# Patient Record
Sex: Male | Born: 1960 | Race: White | Hispanic: No | Marital: Married | Smoking: Never smoker
Health system: Southern US, Community
[De-identification: ages and names within clinical notes are randomized; demographics above are authoritative.]

## PROBLEM LIST (undated history)

## (undated) DIAGNOSIS — I10 Essential (primary) hypertension: Secondary | ICD-10-CM

## (undated) DIAGNOSIS — E119 Type 2 diabetes mellitus without complications: Secondary | ICD-10-CM

## (undated) DIAGNOSIS — J45909 Unspecified asthma, uncomplicated: Secondary | ICD-10-CM

## (undated) DIAGNOSIS — I509 Heart failure, unspecified: Secondary | ICD-10-CM

## (undated) DIAGNOSIS — M1990 Unspecified osteoarthritis, unspecified site: Secondary | ICD-10-CM

## (undated) HISTORY — PX: OTHER SURGICAL HISTORY: SHX169

## (undated) HISTORY — PX: HERNIA REPAIR: SHX51

---

## 2013-08-03 ENCOUNTER — Encounter (HOSPITAL_COMMUNITY): Payer: Self-pay | Admitting: Emergency Medicine

## 2013-08-03 ENCOUNTER — Inpatient Hospital Stay (HOSPITAL_COMMUNITY)
Admission: EM | Admit: 2013-08-03 | Discharge: 2013-08-25 | DRG: 871 | Disposition: E | Payer: Medicare Other | Attending: Internal Medicine | Admitting: Internal Medicine

## 2013-08-03 ENCOUNTER — Emergency Department (HOSPITAL_COMMUNITY): Payer: Medicare Other

## 2013-08-03 DIAGNOSIS — Z885 Allergy status to narcotic agent status: Secondary | ICD-10-CM | POA: Diagnosis not present

## 2013-08-03 DIAGNOSIS — H543 Unqualified visual loss, both eyes: Secondary | ICD-10-CM

## 2013-08-03 DIAGNOSIS — Z888 Allergy status to other drugs, medicaments and biological substances status: Secondary | ICD-10-CM

## 2013-08-03 DIAGNOSIS — R0602 Shortness of breath: Secondary | ICD-10-CM | POA: Diagnosis present

## 2013-08-03 DIAGNOSIS — I509 Heart failure, unspecified: Secondary | ICD-10-CM | POA: Diagnosis present

## 2013-08-03 DIAGNOSIS — E119 Type 2 diabetes mellitus without complications: Secondary | ICD-10-CM | POA: Diagnosis present

## 2013-08-03 DIAGNOSIS — Z9981 Dependence on supplemental oxygen: Secondary | ICD-10-CM

## 2013-08-03 DIAGNOSIS — J45909 Unspecified asthma, uncomplicated: Secondary | ICD-10-CM | POA: Diagnosis present

## 2013-08-03 DIAGNOSIS — H409 Unspecified glaucoma: Secondary | ICD-10-CM | POA: Diagnosis present

## 2013-08-03 DIAGNOSIS — N170 Acute kidney failure with tubular necrosis: Secondary | ICD-10-CM | POA: Diagnosis present

## 2013-08-03 DIAGNOSIS — M129 Arthropathy, unspecified: Secondary | ICD-10-CM | POA: Diagnosis present

## 2013-08-03 DIAGNOSIS — J9622 Acute and chronic respiratory failure with hypercapnia: Secondary | ICD-10-CM

## 2013-08-03 DIAGNOSIS — J189 Pneumonia, unspecified organism: Secondary | ICD-10-CM | POA: Diagnosis present

## 2013-08-03 DIAGNOSIS — IMO0002 Reserved for concepts with insufficient information to code with codable children: Secondary | ICD-10-CM

## 2013-08-03 DIAGNOSIS — Z515 Encounter for palliative care: Secondary | ICD-10-CM

## 2013-08-03 DIAGNOSIS — N189 Chronic kidney disease, unspecified: Secondary | ICD-10-CM | POA: Diagnosis present

## 2013-08-03 DIAGNOSIS — H548 Legal blindness, as defined in USA: Secondary | ICD-10-CM | POA: Diagnosis present

## 2013-08-03 DIAGNOSIS — N179 Acute kidney failure, unspecified: Secondary | ICD-10-CM

## 2013-08-03 DIAGNOSIS — Z66 Do not resuscitate: Secondary | ICD-10-CM | POA: Diagnosis present

## 2013-08-03 DIAGNOSIS — A419 Sepsis, unspecified organism: Principal | ICD-10-CM | POA: Diagnosis present

## 2013-08-03 DIAGNOSIS — Y95 Nosocomial condition: Secondary | ICD-10-CM

## 2013-08-03 DIAGNOSIS — H547 Unspecified visual loss: Secondary | ICD-10-CM | POA: Diagnosis present

## 2013-08-03 DIAGNOSIS — Z6841 Body Mass Index (BMI) 40.0 and over, adult: Secondary | ICD-10-CM

## 2013-08-03 DIAGNOSIS — E662 Morbid (severe) obesity with alveolar hypoventilation: Secondary | ICD-10-CM | POA: Diagnosis present

## 2013-08-03 DIAGNOSIS — Z9119 Patient's noncompliance with other medical treatment and regimen: Secondary | ICD-10-CM

## 2013-08-03 DIAGNOSIS — J9620 Acute and chronic respiratory failure, unspecified whether with hypoxia or hypercapnia: Secondary | ICD-10-CM | POA: Diagnosis present

## 2013-08-03 DIAGNOSIS — I129 Hypertensive chronic kidney disease with stage 1 through stage 4 chronic kidney disease, or unspecified chronic kidney disease: Secondary | ICD-10-CM | POA: Diagnosis present

## 2013-08-03 DIAGNOSIS — N058 Unspecified nephritic syndrome with other morphologic changes: Secondary | ICD-10-CM | POA: Diagnosis present

## 2013-08-03 DIAGNOSIS — Z91199 Patient's noncompliance with other medical treatment and regimen due to unspecified reason: Secondary | ICD-10-CM | POA: Diagnosis not present

## 2013-08-03 DIAGNOSIS — R6521 Severe sepsis with septic shock: Secondary | ICD-10-CM

## 2013-08-03 DIAGNOSIS — R6520 Severe sepsis without septic shock: Secondary | ICD-10-CM

## 2013-08-03 DIAGNOSIS — G4733 Obstructive sleep apnea (adult) (pediatric): Secondary | ICD-10-CM | POA: Diagnosis present

## 2013-08-03 DIAGNOSIS — R6510 Systemic inflammatory response syndrome (SIRS) of non-infectious origin without acute organ dysfunction: Secondary | ICD-10-CM

## 2013-08-03 DIAGNOSIS — N19 Unspecified kidney failure: Secondary | ICD-10-CM

## 2013-08-03 HISTORY — DX: Type 2 diabetes mellitus without complications: E11.9

## 2013-08-03 HISTORY — DX: Heart failure, unspecified: I50.9

## 2013-08-03 HISTORY — DX: Unspecified osteoarthritis, unspecified site: M19.90

## 2013-08-03 HISTORY — DX: Essential (primary) hypertension: I10

## 2013-08-03 HISTORY — DX: Unspecified asthma, uncomplicated: J45.909

## 2013-08-03 LAB — CBC WITH DIFFERENTIAL/PLATELET
Basophils Absolute: 0 10*3/uL (ref 0.0–0.1)
Basophils Relative: 0 % (ref 0–1)
EOS ABS: 0 10*3/uL (ref 0.0–0.7)
Eosinophils Relative: 0 % (ref 0–5)
HCT: 48.4 % (ref 39.0–52.0)
Hemoglobin: 15 g/dL (ref 13.0–17.0)
Lymphocytes Relative: 4 % — ABNORMAL LOW (ref 12–46)
Lymphs Abs: 0.7 10*3/uL (ref 0.7–4.0)
MCH: 28.8 pg (ref 26.0–34.0)
MCHC: 31 g/dL (ref 30.0–36.0)
MCV: 92.9 fL (ref 78.0–100.0)
Monocytes Absolute: 0.7 10*3/uL (ref 0.1–1.0)
Monocytes Relative: 4 % (ref 3–12)
Neutro Abs: 15.5 10*3/uL — ABNORMAL HIGH (ref 1.7–7.7)
Neutrophils Relative %: 92 % — ABNORMAL HIGH (ref 43–77)
PLATELETS: 178 10*3/uL (ref 150–400)
RBC: 5.21 MIL/uL (ref 4.22–5.81)
RDW: 14.1 % (ref 11.5–15.5)
WBC: 16.9 10*3/uL — ABNORMAL HIGH (ref 4.0–10.5)

## 2013-08-03 LAB — I-STAT CHEM 8, ED
BUN: 45 mg/dL — AB (ref 6–23)
Calcium, Ion: 1.04 mmol/L — ABNORMAL LOW (ref 1.12–1.23)
Chloride: 92 mEq/L — ABNORMAL LOW (ref 96–112)
Creatinine, Ser: 3.8 mg/dL — ABNORMAL HIGH (ref 0.50–1.35)
GLUCOSE: 215 mg/dL — AB (ref 70–99)
HCT: 52 % (ref 39.0–52.0)
Hemoglobin: 17.7 g/dL — ABNORMAL HIGH (ref 13.0–17.0)
Potassium: 3.6 mEq/L — ABNORMAL LOW (ref 3.7–5.3)
Sodium: 136 mEq/L — ABNORMAL LOW (ref 137–147)
TCO2: 28 mmol/L (ref 0–100)

## 2013-08-03 LAB — I-STAT TROPONIN, ED: TROPONIN I, POC: 0.03 ng/mL (ref 0.00–0.08)

## 2013-08-03 LAB — PROTIME-INR
INR: 0.96 (ref 0.00–1.49)
Prothrombin Time: 12.8 seconds (ref 11.6–15.2)

## 2013-08-03 LAB — PRO B NATRIURETIC PEPTIDE: PRO B NATRI PEPTIDE: 1658 pg/mL — AB (ref 0–125)

## 2013-08-03 MED ORDER — VANCOMYCIN HCL 10 G IV SOLR
2500.0000 mg | Freq: Once | INTRAVENOUS | Status: AC
  Administered 2013-08-03: 2500 mg via INTRAVENOUS
  Filled 2013-08-03: qty 2500

## 2013-08-03 MED ORDER — PIPERACILLIN-TAZOBACTAM 3.375 G IVPB 30 MIN
3.3750 g | Freq: Once | INTRAVENOUS | Status: DC
  Filled 2013-08-03: qty 50

## 2013-08-03 MED ORDER — ONDANSETRON HCL 4 MG PO TABS: 4.0000 mg | ORAL_TABLET | Freq: Four times a day (QID) | ORAL | Status: DC | PRN

## 2013-08-03 MED ORDER — ACETAMINOPHEN 650 MG RE SUPP: 650.0000 mg | Freq: Four times a day (QID) | RECTAL | Status: DC | PRN

## 2013-08-03 MED ORDER — VANCOMYCIN HCL IN DEXTROSE 1-5 GM/200ML-% IV SOLN: 1000.0000 mg | Freq: Once | INTRAVENOUS | Status: DC

## 2013-08-03 MED ORDER — SODIUM CHLORIDE 0.9 % IV SOLN
INTRAVENOUS | Status: DC
  Administered 2013-08-03 – 2013-08-04 (×2): via INTRAVENOUS

## 2013-08-03 MED ORDER — MORPHINE SULFATE 2 MG/ML IJ SOLN: 1.0000 mg | INTRAMUSCULAR | Status: DC | PRN

## 2013-08-03 MED ORDER — ACETAMINOPHEN 325 MG PO TABS: 650.0000 mg | ORAL_TABLET | Freq: Four times a day (QID) | ORAL | Status: DC | PRN

## 2013-08-03 MED ORDER — INSULIN ASPART 100 UNIT/ML ~~LOC~~ SOLN: 0.0000 [IU] | Freq: Every day | SUBCUTANEOUS | Status: DC

## 2013-08-03 MED ORDER — HEPARIN SODIUM (PORCINE) 5000 UNIT/ML IJ SOLN
5000.0000 [IU] | Freq: Three times a day (TID) | INTRAMUSCULAR | Status: DC
Start: 2013-08-04 — End: 2013-08-05
  Administered 2013-08-04 – 2013-08-05 (×4): 5000 [IU] via SUBCUTANEOUS
  Filled 2013-08-03 (×7): qty 1

## 2013-08-03 MED ORDER — VANCOMYCIN HCL 10 G IV SOLR
2000.0000 mg | INTRAVENOUS | Status: DC
  Administered 2013-08-04: 2000 mg via INTRAVENOUS
  Filled 2013-08-03 (×3): qty 2000

## 2013-08-03 MED ORDER — PANTOPRAZOLE SODIUM 40 MG PO TBEC
40.0000 mg | DELAYED_RELEASE_TABLET | Freq: Every day | ORAL | Status: DC
  Administered 2013-08-04: 40 mg via ORAL
  Filled 2013-08-03: qty 1

## 2013-08-03 MED ORDER — ALPRAZOLAM 0.5 MG PO TABS
1.0000 mg | ORAL_TABLET | Freq: Three times a day (TID) | ORAL | Status: DC
  Administered 2013-08-04 (×2): 1 mg via ORAL
  Filled 2013-08-03 (×2): qty 2

## 2013-08-03 MED ORDER — PIPERACILLIN-TAZOBACTAM 3.375 G IVPB
3.3750 g | Freq: Three times a day (TID) | INTRAVENOUS | Status: DC
  Administered 2013-08-04 – 2013-08-05 (×4): 3.375 g via INTRAVENOUS
  Filled 2013-08-03 (×6): qty 50

## 2013-08-03 MED ORDER — LEVOFLOXACIN IN D5W 750 MG/150ML IV SOLN
750.0000 mg | Freq: Once | INTRAVENOUS | Status: DC
  Administered 2013-08-03: 750 mg via INTRAVENOUS
  Filled 2013-08-03: qty 150

## 2013-08-03 MED ORDER — OXYCODONE HCL 5 MG PO TABS
5.0000 mg | ORAL_TABLET | ORAL | Status: DC | PRN
  Administered 2013-08-04 – 2013-08-05 (×2): 5 mg via ORAL
  Filled 2013-08-03 (×2): qty 1

## 2013-08-03 MED ORDER — IPRATROPIUM-ALBUTEROL 0.5-2.5 (3) MG/3ML IN SOLN
3.0000 mL | Freq: Four times a day (QID) | RESPIRATORY_TRACT | Status: DC
  Administered 2013-08-04 – 2013-08-05 (×5): 3 mL via RESPIRATORY_TRACT
  Filled 2013-08-03 (×5): qty 3

## 2013-08-03 MED ORDER — ONDANSETRON HCL 4 MG/2ML IJ SOLN: 4.0000 mg | Freq: Four times a day (QID) | INTRAMUSCULAR | Status: DC | PRN

## 2013-08-03 MED ORDER — ALBUTEROL SULFATE (2.5 MG/3ML) 0.083% IN NEBU: 2.5000 mg | INHALATION_SOLUTION | RESPIRATORY_TRACT | Status: DC | PRN

## 2013-08-03 MED ORDER — SODIUM CHLORIDE 0.9 % IJ SOLN
3.0000 mL | Freq: Two times a day (BID) | INTRAMUSCULAR | Status: DC
Start: 2013-08-04 — End: 2013-08-06
  Administered 2013-08-03 – 2013-08-05 (×5): 3 mL via INTRAVENOUS

## 2013-08-03 MED ORDER — INSULIN ASPART 100 UNIT/ML ~~LOC~~ SOLN
0.0000 [IU] | Freq: Three times a day (TID) | SUBCUTANEOUS | Status: DC
  Administered 2013-08-04: 5 [IU] via SUBCUTANEOUS

## 2013-08-03 MED ORDER — METHYLPREDNISOLONE SODIUM SUCC 125 MG IJ SOLR
125.0000 mg | Freq: Three times a day (TID) | INTRAMUSCULAR | Status: DC
  Administered 2013-08-04: 125 mg via INTRAVENOUS
  Filled 2013-08-03 (×4): qty 2

## 2013-08-03 MED ORDER — AMLODIPINE BESYLATE 10 MG PO TABS
10.0000 mg | ORAL_TABLET | Freq: Every day | ORAL | Status: DC
  Filled 2013-08-03: qty 1

## 2013-08-03 MED ORDER — ZOLPIDEM TARTRATE 5 MG PO TABS
10.0000 mg | ORAL_TABLET | Freq: Every day | ORAL | Status: DC
  Administered 2013-08-04: 10 mg via ORAL
  Filled 2013-08-03: qty 2
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
## 2013-08-03 NOTE — Progress Notes (Signed)
ANTIBIOTIC CONSULT NOTE - INITIAL  Pharmacy Consult for vancomycin and zosyn Indication: rule out sepsis  Allergies  Allergen Reactions  . Betadine [Povidone Iodine] Hives  . Tylenol With Codeine #3 [Acetaminophen-Codeine] Nausea And Vomiting  . Vicodin [Hydrocodone-Acetaminophen] Nausea And Vomiting    Patient Measurements: Height: 5\' 9"  (175.3 cm) Weight: 420 lb (190.511 kg) IBW/kg (Calculated) : 70.7   Vital Signs: Temp: 94.2 F (34.6 C) (07/10 1951) Temp src: Rectal (07/10 1951) BP: 123/84 mmHg (07/10 2045) Pulse Rate: 81 (07/10 2045) Intake/Output from previous day:   Intake/Output from this shift:    Labs:  Recent Labs  08/17/2013 1930 07/27/2013 1942  WBC 16.9*  --   HGB 15.0 17.7*  PLT 178  --   CREATININE  --  3.80*   Estimated Creatinine Clearance: 37.3 ml/min (by C-G formula based on Cr of 3.8). No results found for this basename: VANCOTROUGH, VANCOPEAK, VANCORANDOM, GENTTROUGH, GENTPEAK, GENTRANDOM, TOBRATROUGH, TOBRAPEAK, TOBRARND, AMIKACINPEAK, AMIKACINTROU, AMIKACIN,  in the last 72 hours   Microbiology: No results found for this or any previous visit (from the past 720 hour(s)).  Medical History: Past Medical History  Diagnosis Date  . Arthritis   . CHF (congestive heart failure)   . Asthma   . Diabetes mellitus without complication   . Hypertension     Medications:  See med list in EMR - patient has received multiple abx recently.  Assessment: 53 year old male with recurrent pneumonia who has been treated with multiple antibiotics and was currently on doxycycline pta. Patient hypothermic in ED, wbc elevated at 17, patient to be admitted for sepsis/HCAP. Orders to continue vancomycin and zosyn that was ordered in the ED. Patient appears to be in acute renal failure with scr of 3.8 (no baseline available). Will dose adjust abx accordingly.  Goal of Therapy:  Vancomycin trough level 15-20 mcg/ml  Plan:  Measure antibiotic drug levels at  steady state Follow up culture results Vancomycin 2.5g now then 2g daily May need random levels as crcl may not be totally indicative of renal function given obsesity Continue zosyn 3.375g IV q8 hours - infuse each dose over 4 hours Follow renal function closely   Sheppard CoilFrank Kailynne Ferrington PharmD., BCPS Clinical Pharmacist Pager [phone] 08/05/2013 10:29 PM

## 2013-08-03 NOTE — ED Notes (Signed)
Pt brought by EMS for SOB, and light pain, pt just travel by car form [place]. Pt with hx of COPD and CHF. VSS by EMS BP 127/70 ,HR 81, R-24 O2 saturation from 64 to 82% on RA. CBG 240 by EMS. Pt denies any chest pain at this time.

## 2013-08-04 ENCOUNTER — Inpatient Hospital Stay (HOSPITAL_COMMUNITY): Payer: Medicare Other

## 2013-08-04 DIAGNOSIS — J189 Pneumonia, unspecified organism: Secondary | ICD-10-CM

## 2013-08-04 DIAGNOSIS — A419 Sepsis, unspecified organism: Secondary | ICD-10-CM

## 2013-08-04 DIAGNOSIS — Y95 Nosocomial condition: Secondary | ICD-10-CM

## 2013-08-04 DIAGNOSIS — J9620 Acute and chronic respiratory failure, unspecified whether with hypoxia or hypercapnia: Secondary | ICD-10-CM

## 2013-08-04 DIAGNOSIS — R6521 Severe sepsis with septic shock: Secondary | ICD-10-CM

## 2013-08-04 DIAGNOSIS — N179 Acute kidney failure, unspecified: Secondary | ICD-10-CM

## 2013-08-04 DIAGNOSIS — I517 Cardiomegaly: Secondary | ICD-10-CM

## 2013-08-04 DIAGNOSIS — N189 Chronic kidney disease, unspecified: Secondary | ICD-10-CM

## 2013-08-04 LAB — CBC
HCT: 43.3 % (ref 39.0–52.0)
HEMOGLOBIN: 13.4 g/dL (ref 13.0–17.0)
MCH: 28.3 pg (ref 26.0–34.0)
MCHC: 30.9 g/dL (ref 30.0–36.0)
MCV: 91.5 fL (ref 78.0–100.0)
Platelets: 161 10*3/uL (ref 150–400)
RBC: 4.73 MIL/uL (ref 4.22–5.81)
RDW: 14.2 % (ref 11.5–15.5)
WBC: 15.4 10*3/uL — ABNORMAL HIGH (ref 4.0–10.5)

## 2013-08-04 LAB — POCT I-STAT 7, (LYTES, BLD GAS, ICA,H+H)
Acid-Base Excess: 2 mmol/L (ref 0.0–2.0)
Bicarbonate: 32.2 mEq/L — ABNORMAL HIGH (ref 20.0–24.0)
Calcium, Ion: 1.01 mmol/L — ABNORMAL LOW (ref 1.12–1.23)
HCT: 37 % — ABNORMAL LOW (ref 39.0–52.0)
Hemoglobin: 12.6 g/dL — ABNORMAL LOW (ref 13.0–17.0)
O2 Saturation: 97 %
POTASSIUM: 4.2 meq/L (ref 3.7–5.3)
Patient temperature: 98
Sodium: 133 mEq/L — ABNORMAL LOW (ref 137–147)
TCO2: 35 mmol/L (ref 0–100)
pCO2 arterial: 77.3 mmHg (ref 35.0–45.0)
pH, Arterial: 7.225 — ABNORMAL LOW (ref 7.350–7.450)
pO2, Arterial: 108 mmHg — ABNORMAL HIGH (ref 80.0–100.0)

## 2013-08-04 LAB — POCT I-STAT 3, ART BLOOD GAS (G3+)
ACID-BASE DEFICIT: 1 mmol/L (ref 0.0–2.0)
Bicarbonate: 30.2 mEq/L — ABNORMAL HIGH (ref 20.0–24.0)
O2 Saturation: 96 %
Patient temperature: 98.1
TCO2: 33 mmol/L (ref 0–100)
pCO2 arterial: 79.8 mmHg (ref 35.0–45.0)
pH, Arterial: 7.184 — CL (ref 7.350–7.450)
pO2, Arterial: 109 mmHg — ABNORMAL HIGH (ref 80.0–100.0)

## 2013-08-04 LAB — GLUCOSE, CAPILLARY
Glucose-Capillary: 140 mg/dL — ABNORMAL HIGH (ref 70–99)
Glucose-Capillary: 183 mg/dL — ABNORMAL HIGH (ref 70–99)
Glucose-Capillary: 203 mg/dL — ABNORMAL HIGH (ref 70–99)
Glucose-Capillary: 169 mg/dL — ABNORMAL HIGH (ref 70–99)

## 2013-08-04 LAB — MRSA PCR SCREENING: MRSA by PCR: NEGATIVE

## 2013-08-04 LAB — BASIC METABOLIC PANEL
Anion gap: 21 — ABNORMAL HIGH (ref 5–15)
BUN: 45 mg/dL — AB (ref 6–23)
CO2: 29 meq/L (ref 19–32)
CREATININE: 4.11 mg/dL — AB (ref 0.50–1.35)
Calcium: 8.4 mg/dL (ref 8.4–10.5)
Chloride: 92 mEq/L — ABNORMAL LOW (ref 96–112)
GFR calc non Af Amer: 15 mL/min — ABNORMAL LOW (ref >90)
GFR, EST AFRICAN AMERICAN: 18 mL/min — AB (ref >90)
Glucose, Bld: 164 mg/dL — ABNORMAL HIGH (ref 70–99)
Potassium: 4.2 mEq/L (ref 3.7–5.3)
SODIUM: 142 meq/L (ref 137–147)

## 2013-08-04 LAB — LACTIC ACID, PLASMA: Lactic Acid, Venous: 0.6 mmol/L (ref 0.5–2.2)

## 2013-08-04 LAB — HEMOGLOBIN A1C
HEMOGLOBIN A1C: 7.7 % — AB (ref <5.7)
Mean Plasma Glucose: 174 mg/dL — ABNORMAL HIGH (ref <117)

## 2013-08-04 MED ORDER — LIDOCAINE HCL (CARDIAC) 20 MG/ML IV SOLN
INTRAVENOUS | Status: AC
  Filled 2013-08-04: qty 5

## 2013-08-04 MED ORDER — ETOMIDATE 2 MG/ML IV SOLN
INTRAVENOUS | Status: AC
  Filled 2013-08-04: qty 20

## 2013-08-04 MED ORDER — LIDOCAINE HCL (PF) 1 % IJ SOLN
INTRAMUSCULAR | Status: AC
  Filled 2013-08-04: qty 5

## 2013-08-04 MED ORDER — SUCCINYLCHOLINE CHLORIDE 20 MG/ML IJ SOLN
INTRAMUSCULAR | Status: AC
  Filled 2013-08-04: qty 1

## 2013-08-04 MED ORDER — ROCURONIUM BROMIDE 50 MG/5ML IV SOLN
INTRAVENOUS | Status: AC
  Filled 2013-08-04: qty 2

## 2013-08-04 MED ORDER — SODIUM CHLORIDE 0.9 % IV BOLUS (SEPSIS)
1000.0000 mL | Freq: Once | INTRAVENOUS | Status: AC
  Administered 2013-08-04: 1000 mL via INTRAVENOUS

## 2013-08-04 MED ORDER — NOREPINEPHRINE BITARTRATE 1 MG/ML IV SOLN
2.0000 ug/min | INTRAVENOUS | Status: DC
  Administered 2013-08-04: 10 ug/min via INTRAVENOUS
  Filled 2013-08-04 (×2): qty 4

## 2013-08-04 MED ORDER — SODIUM CHLORIDE 0.9 % IV BOLUS (SEPSIS): 1000.0000 mL | Freq: Once | INTRAVENOUS | Status: DC

## 2013-08-04 MED ORDER — DEXTROSE 5 % IV SOLN
500.0000 mg | INTRAVENOUS | Status: DC
  Administered 2013-08-04: 500 mg via INTRAVENOUS
  Filled 2013-08-04 (×3): qty 500

## 2013-08-04 NOTE — Progress Notes (Signed)
  [place] TEAM 1 - Stepdown/ICU TEAM  Called by RN to inform me of BP readings into the 60s systolic.  Despite this, pt is reported to have unchanged mental status.  He is reported to be alert and interactive.  He is morbidly obese, making accuracy of cuff pressure doubtful.    I have asked RT to place an arterial line to allow for accurate BP monitoring.  If his SBP is confirmed to be in the 60s, we will have to consider CVL placement and the initiation of pressors (i.e. consult PCCM).  Lonia BloodJeffrey T. Ahaan Zobrist, MD [facility] For Consults/Admissions - Flow Manager - [phone] Office  [phone] Pager [phone]  On-Call/Text Page:      [email or web address]      password [email or web address]
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
## 2013-08-04 NOTE — Procedures (Signed)
Arterial Catheter Insertion Procedure Note Joseph Joseph Sampson [id number] Dec 28, 1960  Procedure: Insertion of Arterial Catheter  Indications: Blood pressure monitoring  Procedure Details Consent: Risks of procedure as well as the alternatives and risks of each were explained to the (patient/caregiver).  Consent for procedure obtained. Time Out: Verified patient identification, verified procedure, site/side was marked, verified correct patient position, special equipment/implants available, medications/allergies/relevent history reviewed, required imaging and test results available.  Performed  Maximum sterile technique was used including antiseptics, cap, gloves, gown, hand hygiene, mask and sheet. Skin prep: Chlorhexidine 20 gauge catheter was inserted into right radial artery using the Seldinger technique.  Evaluation Blood flow good; BP tracing good. Complications: No apparent complications.   Bonney LeitzFowler, Clarece Drzewiecki C 08/04/2013

## 2013-08-04 NOTE — Progress Notes (Signed)
Pt found standing at bedside,Bipap off. Pt momentarily confused ,but re-oriented well . patient  declined reclining chair  at this time.  .   But did lie back in bed( onto Rt side as always). Pt requested Bipap mask to be placed back on (instead of 5L/[place] which was quickly placed on him initially . SBP's still reading 60's and 70's ( RUE , Rt wrist ,RLE below knee,etc....) .

## 2013-08-04 NOTE — Progress Notes (Signed)
eLink Physician-Brief Progress Note Patient Name: Dondra Spryheodore Leckrone DOB: November 28, 1960 MRN: [id number]  Date of Service  08/04/2013   HPI/Events of Note  53 yr old morbidly obese male with septic shock from pneumonia with a declining blood pressure and respiratory status.  He needs intubation and CVC placement.  Patient oriented to person, place, and situation.  Patient refuses intubation and will not lay on his back for CVC placement.  He understands that refusing these interventions or cooperating with these procedures could result in worsening of his illness and death.  He still states he does not want intubation and he will not lay on his back for CVC placement.  Multiple staff persons in room witnessed patient stating his desires.   eICU Interventions  Patient is DNR. CVC will be attempted in present position as IV access is needed to continue needed treatments.   Intervention Category Major Interventions: End of life / care limitation discussion  Henry RusselSMITH, Nejla Reasor, Demetrius Charity 08/04/2013, 8:07 PM

## 2013-08-04 NOTE — Progress Notes (Signed)
B/p's low as during noc shift .Dr(s) aware . Pt sleeping most of the time but answers questions correctly when asked.

## 2013-08-04 NOTE — ED Provider Notes (Signed)
CSN: [id number]     Arrival date & time 08-27-13  1926 History   First MD Initiated Contact with Patient 08/27/2013 1927     Chief Complaint  Patient presents with  . Shortness of Breath     (Consider location/radiation/quality/duration/timing/severity/associated sxs/prior Treatment) Patient is a 53 y.o. male presenting with shortness of breath.  Shortness of Breath Associated symptoms: cough   Associated symptoms: no abdominal pain, no chest pain, no headaches, no rash and no vomiting    patient has history of CHF and asthma. Presents for shortness of breath that began this morning. He was found to be hypoxic per EMS. He is on chronic oxygen. He has had occasional cough. He has been riding in a car down from [place]. He is blind. He has been on antibiotics recently he has been on steroids recently. Has not been in the hospital recently. He has chronic pain in his left lower leg. She appears somewhat mottled and cyanotic. he is morbidly obese  Past Medical History  Diagnosis Date  . Arthritis   . CHF (congestive heart failure)   . Asthma   . Diabetes mellitus without complication   . Hypertension    Past Surgical History  Procedure Laterality Date  . Hip sx    . Hernia repair     History reviewed. No pertinent family history. History  Substance Use Topics  . Smoking status: Never Smoker   . Smokeless tobacco: Never Used  . Alcohol Use: No    Review of Systems  Constitutional: Positive for appetite change. Negative for activity change.  Eyes: Negative for pain.  Respiratory: Positive for cough and shortness of breath. Negative for chest tightness.   Cardiovascular: Positive for leg swelling. Negative for chest pain.  Gastrointestinal: Negative for nausea, vomiting, abdominal pain and diarrhea.  Genitourinary: Negative for flank pain.  Musculoskeletal: Negative for back pain and neck stiffness.  Skin: Negative for rash.  Neurological: Negative for weakness, numbness and  headaches.  Psychiatric/Behavioral: Negative for behavioral problems.      Allergies  Betadine; Tylenol with codeine #3; and Vicodin  Home Medications   Prior to Admission medications   Medication Sig Start Date End Date Taking? Authorizing Provider  albuterol (PROVENTIL) (2.5 MG/3ML) 0.083% nebulizer solution Take 2.5 mg by nebulization 4 (four) times daily.   Yes Historical Provider, MD  ALPRAZolam Prudy Feeler) 1 MG tablet Take 1 mg by mouth 4 (four) times daily.   Yes Historical Provider, MD  amLODipine (NORVASC) 10 MG tablet Take 10 mg by mouth daily.   Yes Historical Provider, MD  doxycycline (VIBRA-TABS) 100 MG tablet Take 100 mg by mouth 2 (two) times daily. 15 day course filled 07/18/13   Yes Historical Provider, MD  furosemide (LASIX) 40 MG tablet Take 40 mg by mouth 2 (two) times daily.   Yes Historical Provider, MD  hydrochlorothiazide (HYDRODIURIL) 25 MG tablet Take 25 mg by mouth daily.   Yes Historical Provider, MD  lisinopril (PRINIVIL,ZESTRIL) 10 MG tablet Take 10 mg by mouth daily.   Yes Historical Provider, MD  metFORMIN (GLUCOPHAGE) 500 MG tablet Take 500 mg by mouth 2 (two) times daily with a meal.   Yes Historical Provider, MD  oxyCODONE-acetaminophen (PERCOCET) 10-325 MG per tablet Take 1 tablet by mouth every 4 (four) hours.   Yes Historical Provider, MD  pantoprazole (PROTONIX) 40 MG tablet Take 40 mg by mouth daily.   Yes Historical Provider, MD  predniSONE (DELTASONE) 10 MG tablet Take 10 mg by mouth daily.  Yes Historical Provider, MD  temazepam (RESTORIL) 30 MG capsule Take 30 mg by mouth at bedtime.   Yes Historical Provider, MD  triamcinolone cream (KENALOG) 0.1 % Apply 1 application topically 3 (three) times daily.   Yes Historical Provider, MD  zolpidem (AMBIEN) 10 MG tablet Take 10 mg by mouth at bedtime.   Yes Historical Provider, MD   BP 104/67  Pulse 90  Temp(Src) 98.3 F (36.8 C) (Oral)  Resp 19  Ht 5\' 9"  (1.753 m)  Wt 420 lb (190.511 kg)  BMI 61.99  kg/m2  SpO2 100% Physical Exam  Constitutional: He is oriented to person, place, and time. He appears well-developed and well-nourished.  Patient is morbidly obese   Eyes: Pupils are equal, round, and reactive to light.  Neck: Normal range of motion. No JVD present.  Cardiovascular:  Mild tachycardia  Pulmonary/Chest:  Tachypnea. Harsh breath sounds, worse on right  Abdominal: Soft. There is no tenderness.  Musculoskeletal: Normal range of motion.  Neurological: He is alert and oriented to person, place, and time.  Skin:  Patient is somewhat mottled, cyanotic    ED Course  Procedures (including critical care time) Labs Review Labs Reviewed  CBC WITH DIFFERENTIAL - Abnormal; Notable for the following:    WBC 16.9 (*)    Neutrophils Relative % 92 (*)    Neutro Abs 15.5 (*)    Lymphocytes Relative 4 (*)    All other components within normal limits  PRO B NATRIURETIC PEPTIDE - Abnormal; Notable for the following:    Pro B Natriuretic peptide (BNP) 1658.0 (*)    All other components within normal limits  I-STAT CHEM 8, ED - Abnormal; Notable for the following:    Sodium 136 (*)    Potassium 3.6 (*)    Chloride 92 (*)    BUN 45 (*)    Creatinine, Ser 3.80 (*)    Glucose, Bld 215 (*)    Calcium, Ion 1.04 (*)    Hemoglobin 17.7 (*)    All other components within normal limits  MRSA PCR SCREENING  CULTURE, BLOOD (ROUTINE X 2)  CULTURE, BLOOD (ROUTINE X 2)  CULTURE, EXPECTORATED SPUTUM-ASSESSMENT  GRAM STAIN  PROTIME-INR  LEGIONELLA ANTIGEN, URINE  STREP PNEUMONIAE URINARY ANTIGEN  CBC  CREATININE, SERUM  BASIC METABOLIC PANEL  CBC  HEMOGLOBIN A1C  I-STAT TROPOININ, ED    Imaging Review Dg Chest Port 1 View  2013-05-28   CLINICAL DATA:  CHF.  Shortness of breath.  EXAM: PORTABLE CHEST - 1 VIEW  COMPARISON:  None.  FINDINGS: Cardiomegaly. Normal pulmonary vascularity. Dense right lower lobe infiltrate and atelectasis. Close follow-up chest x-rays recommended  demonstrate complete clearing to exclude underlying mass . Small right pleural effusion. No pneumothorax. Thoracic spine scoliosis.  IMPRESSION: 1. Dense right lower lobe infiltrate and atelectasis. 2. Cardiomegaly. Normal pulmonary vascularity. Although the right lower lobe infiltrate could be related to congestive heart failure findings are most consistent with pneumonia. Followup chest x-rays are recommended to demonstrate clearing and to exclude underlying mass.   Electronically Signed   By: Maisie Fushomas  Register   On: 02015-05-04 20:20     EKG Interpretation None      MDM   Final diagnoses:  Community acquired pneumonia  Renal failure    Patient with shortness of breath. Initially hypoxic. Found to have pneumonia. Has been on various antibiotics and steroids for his lungs recently. Will give Zosyn and vancomycin admitted to internal medicine.    Juliet RudeNathan R. Rubin PayorPickering, MD  08/04/13 0022 

## 2013-08-04 NOTE — Consult Note (Addendum)
PULMONARY  / CRITICAL CARE MEDICINE  Name: Joseph Sampson MRN: [id number] DOB: 06/01/1960 PCP No primary provider on file.   ADMISSION DATE:  08/24/2013 LOS 1 days  CONSULTATION DATE:  08/04/13  REFERRING MD :  [facility]   CHIEF COMPLAINT:  Pneumonia/sepsis/hypotension  BRIEF PATIENT DESCRIPTION:  53 yo morbidly obese male with DM2, OSA blindness due to glaucoma admitted 7/10 with PNA and respiratory failure. Developed worsening respiratory distress and hypotension on 7/11 and CCM consulted. He refused intubation. Now DNR/DNI. Pressors ok.    LINES / TUBES: 7/11 R radial arterial line 7/11 [facility] CV   CULTURES: 7/11 BCX x 2  ANTIBIOTICS: 7/11 Vancomycin>> 7/11 Zosyn>> 7/11 Azithro>>   SIGNIFICANT EVENTS / STUDIES:  7/10 started BiPap   HISTORY OF PRESENT ILLNESS:    53 y/o with past medical history significant for morbid obesity (419 pounds) obstructive sleep apnea on BiPAP, DM2, pneumonia treated few months ago and blindness due to glaucoma [place] [facility] with dizziness upon standing . In the emergency room he was found to be hypotensive, hypothermic, with respiratory and renal failure. Found to have a large right-sided infiltrate. Admitted to [facility] service.   On 7/11 developed progressive hypotension with BP 63/42. Started on Levophed. Abx broadened. ABG 7.22/77/108/97% on Bipap 20/4. CCM called. Patient refused intubation requesting DNR/DNI. Pressors ok.   C/o SOB. Denies CP. Refusing to get into any other position except his R side. Cr now 3.8->4.2. Baseline unclear. [facility] and no labs available.  Echo 08/04/13 EF 65-70%. RV not seen.   Patient unable to provide much history due to BiPAP and dyspnea.   ROS General: Weight gain [ ] ; Weight loss [ ] ; Anorexia [ ] ; Fatigue Cove.Etienne[y ]; Fever [ ] ; Chills [ ] ; Weakness [ ]   Cardiac: Chest pain/pressure [ ] ; Resting SOB Cove.Etienne[y ]; Exertional SOB [ ] ;  Orthopnea [ ] ; Pedal Edema [ ] ; Palpitations [ ] ; Syncope [ ] ; Presyncope [ ] ; Paroxysmal nocturnal dyspnea[ ]   Pulmonary: Cough [ y]; [facility] ]; Hemoptysis[ ] ; Sputum [ ] ; Snoring [ ]   GI: Vomiting[ ] ; Dysphagia[ ] ; Melena[ ] ; Hematochezia [ ] ; Heartburn[ ] ; Abdominal pain [ ] ; Constipation [ ] ; Diarrhea [ ] ; BRBPR [ ]   GU: Hematuria[ ] ; Dysuria [ ] ; Nocturia[ ]   Vascular: Pain in legs with walking [ ] ; Pain in feet with lying flat [ ] ; Non-healing sores [ ] ; Stroke [ ] ; TIA [ ] ; Slurred speech [ ] ;  Neuro: Headaches[ ] ; Vertigo[ ] ; Seizures[ ] ; Paresthesias[ ] ;Blurred vision [ ] ; Diplopia [ ] ; Vision changes [ ]   Ortho/Skin: Arthritis [ ] ; Joint pain y[ ] ; Muscle pain [ ] ; Joint swelling [ ] ; Back Pain [ ] ; Rash [ ]   Psych: Depression[ ] ; Anxiety[ ]   Heme: Bleeding problems [ ] ; Clotting disorders [ ] ; Anemia [ ]   Endocrine: Diabetes Cove.Etienne[y ]; Thyroid dysfunction[ ]    PAST MEDICAL HISTORY :  Past Medical History  Diagnosis Date  . Arthritis   . CHF (congestive heart failure)   . Asthma   . Diabetes mellitus without complication   . Hypertension      History   Social History  . Marital Status: Married    Spouse Name: N/A    Number of Children: N/A  . Years of Education: N/A   Occupational History  . Not on file.   Social History Main Topics  . Smoking status: Never Smoker   . Smokeless tobacco: Never  Used  . Alcohol Use: No  . Drug Use: No  . Sexual Activity: Not on file   Other Topics Concern  . Not on file   Social History Narrative  . No narrative on file    FAMILY HX: Unavailable due to condition  Allergies  Allergen Reactions  . Betadine [Povidone Iodine] Hives  . Tylenol With Codeine #3 [Acetaminophen-Codeine] Nausea And Vomiting  . Vicodin [Hydrocodone-Acetaminophen] Nausea And Vomiting      (Not in an outpatient encounter)  SUBJECTIVE:   VITAL SIGNS: Filed Vitals:   08/04/13 1615 08/04/13 1700 08/04/13 1800 08/04/13 1900  BP:  78/49 83/33 105/88   Pulse: 94   88  Temp:      TempSrc:      Resp: 16 13 19 19   Height:      Weight:      SpO2: 96%   98%      HEMODYNAMICS:   VENTILATOR SETTINGS: Vent Mode:  [-] PRVC FiO2 (%):  [30 %] 30 % Set Rate:  [28 bmp] 28 bmp Vt Set:  [420 mL] 420 mL PEEP:  [5 cmH20] 5 cmH20 Plateau Pressure:  [4 cmH20-21 cmH20] 20 cmH20 INTAKE / OUTPUT: I/O last 3 completed shifts: In: 3688.3 [P.O.:480; I.V.:608.3; IV Piggyback:2600] Out: -      PHYSICAL EXAMINATION: General:  Morbidly obese male. Lying on R side on BIPAP. Dyspneic HEENT: normal Neck: supple. Very thick. Unable to see JVD. Carotids 2+ bilat; no bruits. Cor: PMI nonpalpable. Very distant heart sounds. No obvious murmur.   Lungs: decreased BS. Mild rhonchi. No wheeze Abdomen: Markedly obese. Soft/ NT/ND. Good bs Extremities: no cyanosis, or edema Neuro: Lethargic but follows commands and can talk in brief sentences. Non-focal.    LABS: PULMONARY  Recent Labs Lab Aug 11, 2013 1942 08/04/13 1949  PHART  --  7.225*  PCO2ART  --  77.3*  PO2ART  --  108.0*  HCO3  --  32.2*  TCO2 28 35  O2SAT  --  97.0    CBC  Recent Labs Lab 08-11-2013 1930 August 11, 2013 1942 08/04/13 0038 08/04/13 1949  HGB 15.0 17.7* 13.4 12.6*  HCT 48.4 52.0 43.3 37.0*  WBC 16.9*  --  15.4*  --   PLT 178  --  161  --     COAGULATION  Recent Labs Lab 08/11/13 1930  INR 0.96    CARDIAC  No results found for this basename: TROPONINI,  in the last 168 hours  Recent Labs Lab 11-Aug-2013 1930  PROBNP 1658.0*     CHEMISTRY  Recent Labs Lab 2013-08-11 1942 08/04/13 0038 08/04/13 1949  NA 136* 142 133*  K 3.6* 4.2 4.2  CL 92* 92*  --   CO2  --  29  --   GLUCOSE 215* 164*  --   BUN 45* 45*  --   CREATININE 3.80* 4.11*  --   CALCIUM  --  8.4  --    Estimated Creatinine Clearance: 34.4 ml/min (by C-G formula based on Cr of 4.11).   LIVER  Recent Labs Lab 08-11-2013 1930  INR 0.96     INFECTIOUS No results found for this  basename: LATICACIDVEN, PROCALCITON,  in the last 168 hours   ENDOCRINE CBG (last 3)   Recent Labs  Aug 11, 2013 2354 08/04/13 1234 08/04/13 1542  GLUCAP 140* 183* 203*         IMAGING x48h  Dg Chest Port 1 View  08-11-2013   CLINICAL DATA:  CHF.  Shortness of breath.  EXAM: PORTABLE CHEST - 1 VIEW  COMPARISON:  None.  FINDINGS: Cardiomegaly. Normal pulmonary vascularity. Dense right lower lobe infiltrate and atelectasis. Close follow-up chest x-rays recommended demonstrate complete clearing to exclude underlying mass . Small right pleural effusion. No pneumothorax. Thoracic spine scoliosis.  IMPRESSION: 1. Dense right lower lobe infiltrate and atelectasis. 2. Cardiomegaly. Normal pulmonary vascularity. Although the right lower lobe infiltrate could be related to congestive heart failure findings are most consistent with pneumonia. Followup chest x-rays are recommended to demonstrate clearing and to exclude underlying mass.   Electronically Signed   By: Maisie Fus  Register   On: 08/23/2013 20:20       ASSESSMENT / PLAN:  PULMONARY A: 1) Acute on chronic respiratory failure     2) HAP - with large R-sided infiltrate     3) OHS     4) OSA  P:   Refuses intubation. Will continue BIPAP support. Given recent treatment for PNA in [place] will treat for HAP. Sepsis protocol. Get sputum cx.   INFECTIOUS A:  1) Sepsis due to PNA P:   Treat for HAP. Support BP with Levophed for now. Sepsis protocol. CVL placed   CARDIOVASCULAR A:  1) Septic shock/Hypotension P:  Likely sepsis mediated. EF 65-70% by echo. RV not seen. Support with Levophed. Follow co-ox and CVP.   RENAL A:  1) Acute renal failure, stage IV - suspect ATN P:   Unclear baseline. Support BP. Place Foley. Attempt to get old records. Suspect he has diabetic nephropathy. May need Renal input. No acute need for HD.   ENDOCRINE A:  1) DM2 P:   Cover with insulin and SSI. Check am cortisol.   GLOBAL A 1) DNR/DNI.  Ok with pressors. Confirmed with patient in front of nurses.      2) Protonix for stress ulcer prophylaxis. Lovenox for DVT prophylaxis.   The patient is critically ill with multiple organ systems failure and requires high complexity decision making for assessment and support, frequent evaluation and titration of therapies, application of advanced monitoring technologies and extensive interpretation of multiple databases.   Critical Care Time devoted to patient care services described in this note is  45  Minutes.   Mar Daring Critical Care Medicine 08/04/2013 8:44 PM

## 2013-08-04 NOTE — Progress Notes (Signed)
Echo Lab  2D Echocardiogram completed.  Abriel Geesey L Giancarlos Berendt, RDCS 08/04/2013 2:14 PM

## 2013-08-04 NOTE — Significant Event (Signed)
Patient arrived to dept at approximately 2345pm; Upon arrival patient was alert and oriented to self and place. Patient requested assistance to get OOB to bedside commode as well as requested his night time medications and something to eat upon arrival to department. After assessment, patient was assisted to bedside commode per nursing staff with steadying assistance and medications were administered as requested. Patient was assisted back to bed after urinating and placed on bipap per RT. At approximately 0030am, patient's blood pressures were noted to be in the 60's systolic and patient was arousable to painful stimuli only. Joseph McgregorMary Lynch, NP notified and orders obtained. Patient now arousable to painful as well as verbal stimuli, conversant however confused, remains hypotensive with SBP in the 60's. Joseph McgregorMary Lynch, NP paged to provide an update.

## 2013-08-04 NOTE — Procedures (Signed)
Central Venous Catheter Insertion Procedure Note Joseph Sampson [id number] 1961-01-20  Procedure: Insertion of Central Venous Catheter Indications: Drug and/or fluid administration  Procedure Details Consent: Unable to obtain consent because of emergent medical necessity. Time Out: Verified patient identification, verified procedure, site/side was marked, verified correct patient position, special equipment/implants available, medications/allergies/relevent history reviewed, required imaging and test results available.  Performed  Maximum sterile technique was used including antiseptics, cap, gloves, gown, hand hygiene, mask and sheet. Skin prep: Chlorhexidine; local anesthetic administered A antimicrobial bonded/coated triple lumen catheter was placed in the left internal jugular vein using the Seldinger technique.  Evaluation Blood flow good Complications: No apparent complications Patient did tolerate procedure well. Chest X-ray ordered to verify placement.  CXR: pending.  Joseph Sampson 08/04/2013, 9:14 PM

## 2013-08-04 NOTE — Progress Notes (Signed)
Subjective/Objective 53 year old male admitted 7/10 with hypotension and hypothermia. Chest xray showing right lower lobe pneumonia. Started on Zosyn and Vancomycin for HCAP.  Patient continued with hypotension in spite of fluid resuscitation. A line was placed to confirm BPs. Aline pressures in the 70's with MAP 50s.  Scheduled Meds: . ALPRAZolam  1 mg Oral TID AC & HS  . heparin  5,000 Units Subcutaneous 3 times per day  . insulin aspart  0-15 Units Subcutaneous TID WC  . insulin aspart  0-5 Units Subcutaneous QHS  . ipratropium-albuterol  3 mL Nebulization Q6H  . pantoprazole  40 mg Oral Daily  . piperacillin-tazobactam (ZOSYN)  IV  3.375 g Intravenous 3 times per day  . sodium chloride  3 mL Intravenous Q12H  . vancomycin  2,000 mg Intravenous Q24H   Continuous Infusions: . sodium chloride 150 mL/hr at 08/04/13 1030  . norepinephrine (LEVOPHED) Adult infusion     PRN Meds:acetaminophen, acetaminophen, albuterol, morphine injection, ondansetron (ZOFRAN) IV, ondansetron, oxyCODONE  Vital signs in last 24 hours: Temp:  [94.2 F (34.6 C)-98.3 F (36.8 C)] 98 F (36.7 C) (07/11 1553) Pulse Rate:  [81-104] 88 (07/11 1900) Resp:  [13-28] 19 (07/11 1900) BP: (61-152)/(16-114) 105/88 mmHg (07/11 1900) SpO2:  [86 %-100 %] 98 % (07/11 1900) Arterial Line BP: (63)/(51) 63/51 mmHg (07/11 1900) FiO2 (%):  [40 %-100 %] 40 % (07/11 0228) Weight:  [190.057 kg (419 lb)-190.511 kg (420 lb)] 190.057 kg (419 lb) (07/11 0553)  Intake/Output last 3 shifts: I/O last 3 completed shifts: In: 3688.3 [P.O.:480; I.V.:608.3; IV Piggyback:2600] Out: -  Intake/Output this shift:    Problem Assessment/Plan  Hypotension: have consulted PCCM for further medical management - likely needs vasopressors. Transfer to ICU 38M-15.  Sepsis: continue current antibiotic regimen.

## 2013-08-04 NOTE — Progress Notes (Signed)
Report called to Delsa Saleyril ,RN ,unit 2100 .

## 2013-08-05 ENCOUNTER — Inpatient Hospital Stay (HOSPITAL_COMMUNITY): Payer: Medicare Other

## 2013-08-05 LAB — CBC
HCT: 35.7 % — ABNORMAL LOW (ref 39.0–52.0)
Hemoglobin: 10.9 g/dL — ABNORMAL LOW (ref 13.0–17.0)
MCH: 27.7 pg (ref 26.0–34.0)
MCHC: 30.5 g/dL (ref 30.0–36.0)
MCV: 90.8 fL (ref 78.0–100.0)
PLATELETS: 156 10*3/uL (ref 150–400)
RBC: 3.93 MIL/uL — AB (ref 4.22–5.81)
RDW: 14.8 % (ref 11.5–15.5)
WBC: 12.8 10*3/uL — ABNORMAL HIGH (ref 4.0–10.5)

## 2013-08-05 LAB — COMPREHENSIVE METABOLIC PANEL
ALBUMIN: 2.4 g/dL — AB (ref 3.5–5.2)
ALK PHOS: 43 U/L (ref 39–117)
ALT: 27 U/L (ref 0–53)
ANION GAP: 20 — AB (ref 5–15)
AST: 28 U/L (ref 0–37)
BUN: 55 mg/dL — AB (ref 6–23)
CHLORIDE: 93 meq/L — AB (ref 96–112)
CO2: 25 meq/L (ref 19–32)
Calcium: 7.1 mg/dL — ABNORMAL LOW (ref 8.4–10.5)
Creatinine, Ser: 5.02 mg/dL — ABNORMAL HIGH (ref 0.50–1.35)
GFR calc Af Amer: 14 mL/min — ABNORMAL LOW (ref >90)
GFR, EST NON AFRICAN AMERICAN: 12 mL/min — AB (ref >90)
Glucose, Bld: 223 mg/dL — ABNORMAL HIGH (ref 70–99)
POTASSIUM: 3.7 meq/L (ref 3.7–5.3)
Sodium: 138 mEq/L (ref 137–147)
Total Protein: 5.3 g/dL — ABNORMAL LOW (ref 6.0–8.3)

## 2013-08-05 LAB — GLUCOSE, CAPILLARY
GLUCOSE-CAPILLARY: 198 mg/dL — AB (ref 70–99)
Glucose-Capillary: 123 mg/dL — ABNORMAL HIGH (ref 70–99)
Glucose-Capillary: 172 mg/dL — ABNORMAL HIGH (ref 70–99)
Glucose-Capillary: 204 mg/dL — ABNORMAL HIGH (ref 70–99)
Glucose-Capillary: 245 mg/dL — ABNORMAL HIGH (ref 70–99)

## 2013-08-05 LAB — CARBOXYHEMOGLOBIN
CARBOXYHEMOGLOBIN: 1 % (ref 0.5–1.5)
Methemoglobin: 1 % (ref 0.0–1.5)
O2 Saturation: 83.6 %
TOTAL HEMOGLOBIN: 11.6 g/dL — AB (ref 13.5–18.0)

## 2013-08-05 MED ORDER — ALPRAZOLAM 0.5 MG PO TABS: 0.5000 mg | ORAL_TABLET | Freq: Two times a day (BID) | ORAL | Status: DC

## 2013-08-05 MED ORDER — LORAZEPAM 1 MG PO TABS: 1.0000 mg | ORAL_TABLET | ORAL | Status: DC | PRN

## 2013-08-05 MED ORDER — PREDNISONE 10 MG PO TABS
10.0000 mg | ORAL_TABLET | Freq: Every day | ORAL | Status: DC
  Administered 2013-08-05: 10 mg via ORAL
  Filled 2013-08-05: qty 1

## 2013-08-05 MED ORDER — LEVOFLOXACIN 750 MG PO TABS
750.0000 mg | ORAL_TABLET | Freq: Every day | ORAL | Status: DC
  Administered 2013-08-05: 750 mg via ORAL
  Filled 2013-08-05: qty 1

## 2013-08-05 MED ORDER — LORAZEPAM 2 MG/ML IJ SOLN: 1.0000 mg | INTRAMUSCULAR | Status: DC | PRN

## 2013-08-05 MED ORDER — ALPRAZOLAM 0.5 MG PO TABS
0.5000 mg | ORAL_TABLET | Freq: Three times a day (TID) | ORAL | Status: DC
  Administered 2013-08-05 (×2): 0.5 mg via ORAL
  Filled 2013-08-05 (×3): qty 1

## 2013-08-05 MED ORDER — MORPHINE SULFATE 2 MG/ML IJ SOLN
1.0000 mg | INTRAMUSCULAR | Status: DC | PRN
Start: 2013-08-05 — End: 2013-08-06
  Administered 2013-08-05 (×2): 2 mg via INTRAVENOUS
  Filled 2013-08-05 (×2): qty 1

## 2013-08-05 NOTE — Progress Notes (Signed)
Mr. Joseph Sampson's oxygen level drops when he sleeps with his face in the pillow.  He refuses to sleep any other way reporting it is not comfortable.   Jacqulyn Canehristopher Scott Jaxin Fulfer RN, BSN, CCRN

## 2013-08-05 NOTE — Progress Notes (Signed)
Patient remains and appears alert and oriented, does not upon my assessment have any clouding of his judgement. He demands he wants his artrial line taken out, his central line taken out and his foley out. He says the only thing he wants is his oxygen. He demanded he wanted his levophed drip stopped. i have explained to him the danger of stopping this medication, i have also explain to him the important of keeping this line. He agrees he understands and do not want them. i have notified Dr Deterding about all this. She has camera in, and is aware of the situation. Running it through her, i have taken out all the lines he wanted out. i have him on 5 liters of oxygen , b/c he had earlier refuse to continue on BiPAP. He remains in respiratory distress with labored breathing and difficulty breathing. I have notified the friend that is listed as emergency contact, Theodoro Grist(Dave) twice of his decision. He says he cannot be here tonight and did not say much.  Will continue to monitor and provide care and update the doctors.

## 2013-08-05 NOTE — Progress Notes (Signed)
Paged MD to let know about declining oxygenation and placed on ventimask 40%. O2 sats 82% at lowest and placed on nonrebreather. O2 sats up to 92%. Tried repositioning patient on back and sitting bed up but patient refuses and keeps rolling onto right side. Propped pillows under right arm to try and keep him off of it.  Morphine given to help with breathing. Will continue to monitor and make comfortable.

## 2013-08-05 NOTE — Progress Notes (Signed)
08/05/13 On my introduction to Mr. Joseph Sampson this AM he reported his desire to go home today and continued his stance to not be intubated, or escalate his care and is prepared to go home.  I informed him that I had attempted to make contact with his emergency contact in his EMR, Joseph Sampson, at [phone] and that he did not answer and his voice mail was not setup to handle messages.  I attempted to make contact with Joseph Sampson at [phone] who Mr. Joseph Sampson reports [place] and [place] Sampson.  There was NO answer at the Number [facility] and there was NOT a Voice message system setup to leave a message.  Mr Joseph Sampson provided me with a phone number for his daughter Joseph Sampson of [phone].  I attempted contact to this number and there was NO answer, but I was able to leave a message requesting a return call. I informed Mr. Joseph Sampson of these efforts and he reported there was nobody else to call at this time.  I informed him I would continue to attempt these numbers throughout the day.  I spoke with Mr. Joseph Sampson again and he confirmed his wishes to not escalate care and focus on comfort at this time.   Jacqulyn Canehristopher Scott Mitsuko Luera RN, BSN, CCRN
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
## 2013-08-05 NOTE — Progress Notes (Signed)
NURSING PROGRESS NOTE  Joseph Sampson [id number] Transfer Data: 08/05/2013 3:37 PM Attending Provider: Lonia BloodJeffrey T McClung, MD PCP:No primary provider on file. Code Status: DNR/DNI   Joseph Spryheodore Sampson is a 53 y.o. male patient transferred from 50M  -No acute distress noted.  -No complaints of shortness of breath.  -No complaints of chest pain.   Last Documented Vital Signs: Blood pressure 72/58, pulse 102, temperature 97.9 F (36.6 C), temperature source Axillary, resp. rate 20, height 5\' 9"  (1.753 m), weight 190.057 kg (419 lb), SpO2 91.00%.  IV Fluids:  IV in place, occlusive dsg intact without redness, IV cath antecubital left, condition patent and no redness, and upper arm right, condition patent and no redness and left, condition patent and no redness none.   Allergies:  Betadine; Tylenol with codeine #3; and Vicodin  Past Medical History:   has a past medical history of Arthritis; CHF (congestive heart failure); Asthma; Diabetes mellitus without complication; and Hypertension.  Past Surgical History:   has past surgical history that includes hip sx and Hernia repair.  Social History:   reports that he has never smoked. He has never used smokeless tobacco. He reports that he does not drink alcohol or use illicit drugs.  Skin: intact  Patient/Family orientated to room. Information packet given to patient/family. Admission inpatient armband information verified with patient/family to include name and date of birth and placed on patient arm. Side rails up x 2, fall assessment and education completed with patient/family. Patient/family able to verbalize understanding of risk associated with falls and verbalized understanding to call for assistance before getting out of bed. Call light within reach. Patient/family able to voice and demonstrate understanding of unit orientation instructions.

## 2013-08-05 NOTE — Progress Notes (Signed)
10:20AM I was able to make contact with Mr. Corine ShelterHardy's daughter Toma CopierBethany and at his request I provided and update to her and made her aware of the medical teams plan of care to focus on comfort at the direction of her father's wishes.  I provided her with contact information to the hospital and explained that her father wants to come home and how that may work.  I offered support and answered all questions that I could at this time.  Toma CopierBethany reported that her father was sick when he he left town and that against her requests/wishes he went anyway.  Toma CopierBethany reports NO concerns with the plan to focus on Palliative measures with her fathers care and not provide any escalation of care.    Jacqulyn Canehristopher Scott Camiya Vinal RN, BSN, CCRN

## 2013-08-05 NOTE — Progress Notes (Signed)
Pt had been taken off Bipap about 30 minutes prior in order to eat, at pt request. Per RN pt refuses to be placed back on Bipap at this time. Pt vitals WNL at this time on nasal cannula.

## 2013-08-05 NOTE — Progress Notes (Signed)
Talked to the daughter Toma CopierBethany to give an update on how her father is doing. Says she can try and contact his friend Theodoro GristDave who he is in town with. I have tried to call him a couple times with no answer and no voicemail set up.  Gave her his new room number and the phone number to the main desk to call with any questions she may have.

## 2013-08-05 NOTE — Progress Notes (Signed)
Patient ID: Joseph Spryheodore Sampson, male   DOB: Feb 28, 1960, 53 y.o.   MRN: [id number] Updated pt on status Alert and oriented  Understands can die without ETT Refuses Continued NIMV  Joseph RossettiDaniel J. Sampson AliasFeinstein, MD, FACP Pgr: [phone] [facility]

## 2013-08-05 NOTE — Progress Notes (Signed)
11:20AM Mr. Corine ShelterHardy's friends Theodoro Grist(Dave & Joselyn Glassmanyler) that he has been traveling with arrived to unit to visit.  Provided update to them with permission of Mr. Susette RacerHardy.  Explained to them that it may take a day or two to sort through the details of his transfer back home.  They provided decline in Mr. Corine ShelterHardy's health over the past three years and an unstable support system.   Jacqulyn Canehristopher Scott Kenslee Achorn RN, BSN, CCRN

## 2013-08-05 NOTE — Progress Notes (Signed)
Mr. Joseph Sampson has pulled off Cardiac Monitor and Oxygen Sat probe and does not want to wear them anymore.   Jacqulyn Canehristopher Scott Ashish Rossetti RN, BSN, CCRN

## 2013-08-05 NOTE — Progress Notes (Signed)
Made contact with Joseph Sampson to make him aware of transfer and new room number.  Jacqulyn Canehristopher Scott Marlyn Tondreau RN, BSN, CCRN

## 2013-08-05 NOTE — Progress Notes (Signed)
Patient refuse scds.

## 2013-08-05 NOTE — Progress Notes (Signed)
Patient threaten to leave hospital AMS. Said he has not eaten, MD notified and food offered to patient. He refuse to get back on BiPAP, says he does,t need it and do not want it. I have explain to him his present condition and eventual consequences. He is quiet alert and understands the consequences of his decisions. Dr Brooks SailorsFeinstien has been here twice to see the patient. He has explain to the patient the consequences of this decisions. Patient is alert, oriented and admits clearly understanding that he could die.

## 2013-08-06 DIAGNOSIS — N179 Acute kidney failure, unspecified: Secondary | ICD-10-CM

## 2013-08-06 DIAGNOSIS — N189 Chronic kidney disease, unspecified: Secondary | ICD-10-CM

## 2013-08-06 LAB — GLUCOSE, CAPILLARY: GLUCOSE-CAPILLARY: 208 mg/dL — AB (ref 70–99)
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
## 2013-08-10 LAB — CULTURE, BLOOD (ROUTINE X 2)
CULTURE: NO GROWTH
Culture: NO GROWTH

## 2013-08-25 NOTE — Progress Notes (Signed)
Called into room by Nurse Tech to find patient breathless, pulseless, and cool to touch. Pt's skin was mottled. Pt was assessed by two RNs. Provider on call was notified of pt's time of death.

## 2013-08-25 NOTE — Progress Notes (Signed)
Pt's daughter Toma CopierBethany returned phone call and nurse explained to daughter that pt was deceased and passed away around [id number]. Unable to get any information from daughter because she was so upset. She hung up the phone. Will wait for a phone call back

## 2013-08-25 NOTE — Progress Notes (Signed)
Called pt's daughter Toma CopierBethany with no luck. Left a voicemail asking daughter to please call back ASAP.

## 2013-08-25 NOTE — Progress Notes (Signed)
Paged provider on call about pt being unresponsive at shift change. No further orders given except a PRN dose of ativan. Will continue to monitor pt.

## 2013-08-25 NOTE — Progress Notes (Signed)
Called by RN at 0056 on July 13,2015 stating that patient had passed at 0051. Death certificate signed.

## 2013-08-25 NOTE — Progress Notes (Signed)
Pt in bed resting comfortably. Pt in no apparent distress at this time. Respirations are unlabored

## 2013-08-25 DEATH — deceased

## 2015-06-16 IMAGING — CR DG CHEST 1V PORT
1 series · 1 of 1 positions shown · non-contrast
Comparison: None.

CLINICAL DATA: CHF.  Shortness of breath.

EXAM:
PORTABLE CHEST - 1 VIEW

[AP]
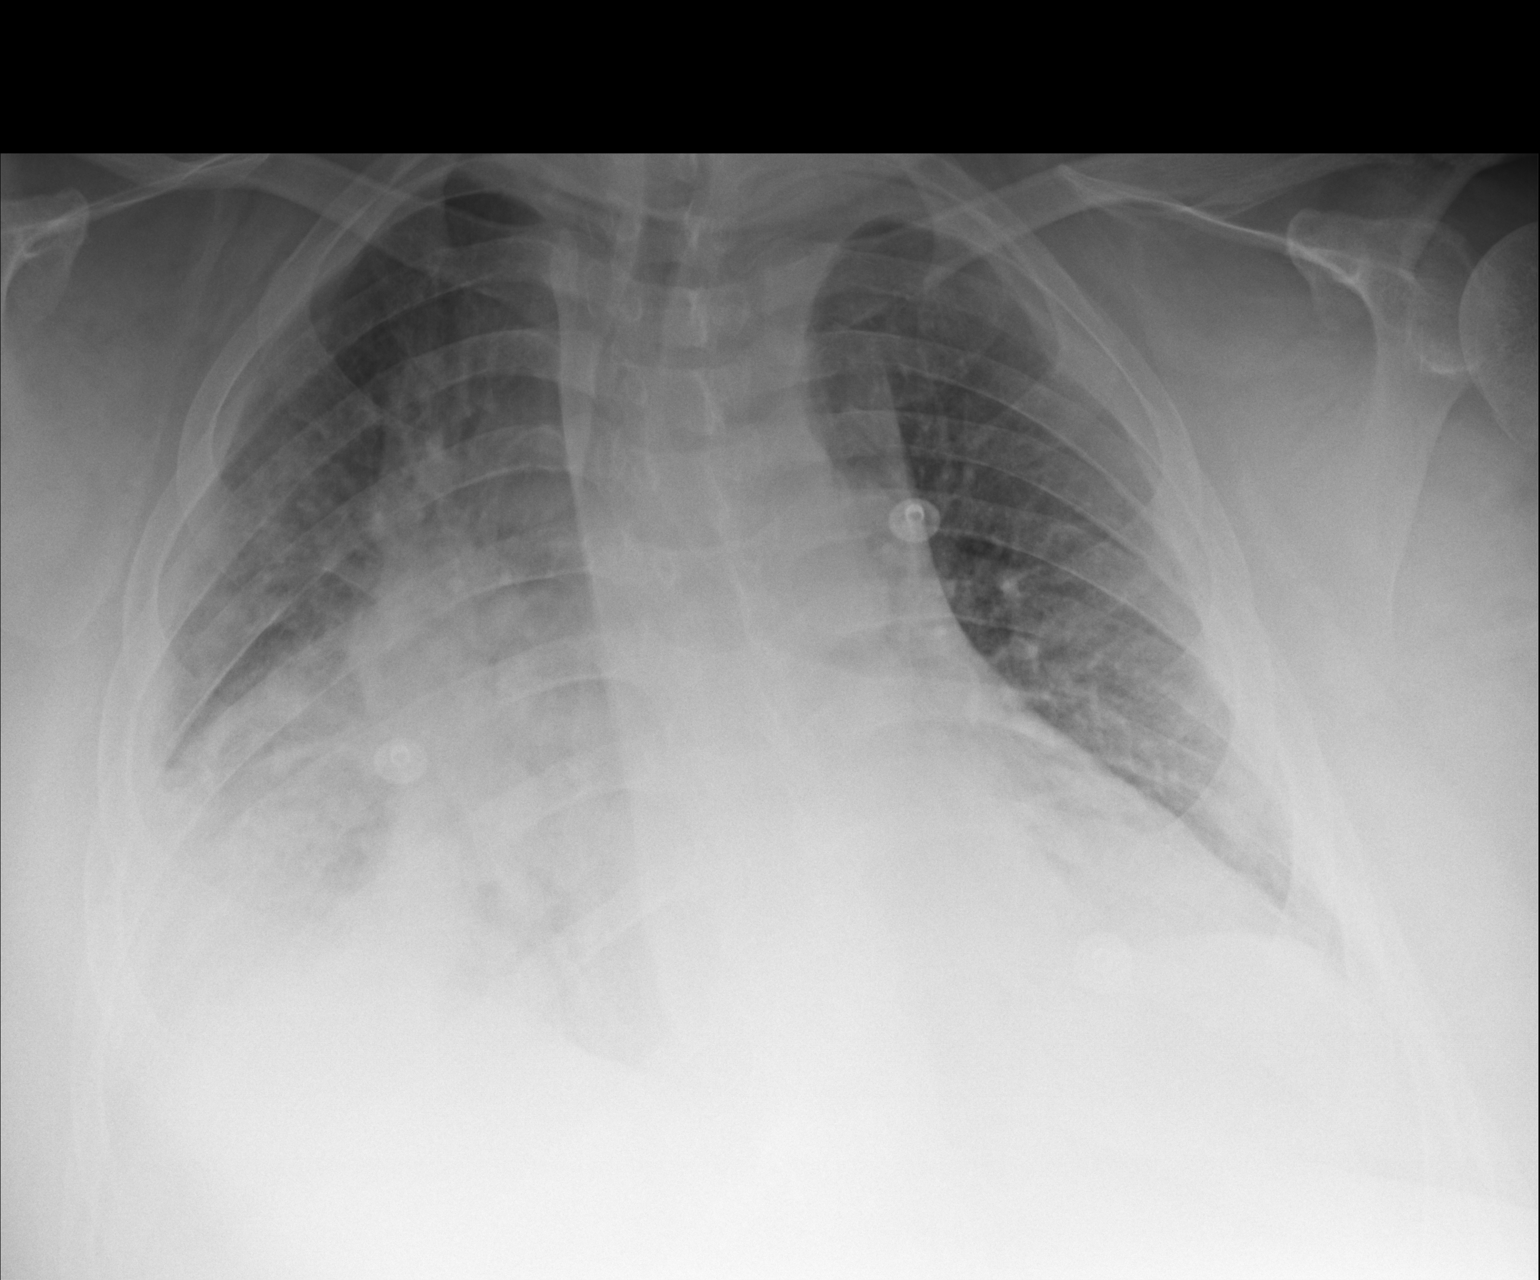

[1 of 1 positions shown; findings below may reference images not displayed]

FINDINGS: Cardiomegaly. Normal pulmonary vascularity. Dense right lower lobe
infiltrate and atelectasis. Close follow-up chest x-rays recommended
demonstrate complete clearing to exclude underlying mass . Small
right pleural effusion. No pneumothorax. Thoracic spine scoliosis.
IMPRESSION: 1. Dense right lower lobe infiltrate and atelectasis.
2. Cardiomegaly. Normal pulmonary vascularity. Although the right
lower lobe infiltrate could be related to congestive heart failure
findings are most consistent with pneumonia.
Followup chest x-rays are recommended to demonstrate clearing and to
exclude underlying mass.

## 2015-06-17 IMAGING — CR DG CHEST 1V PORT
2 series · 2 of 2 positions shown · non-contrast
Comparison: 08/03/2013

CLINICAL DATA: Central line placement

EXAM:
PORTABLE CHEST - 1 VIEW

[AP (1 of 2)]
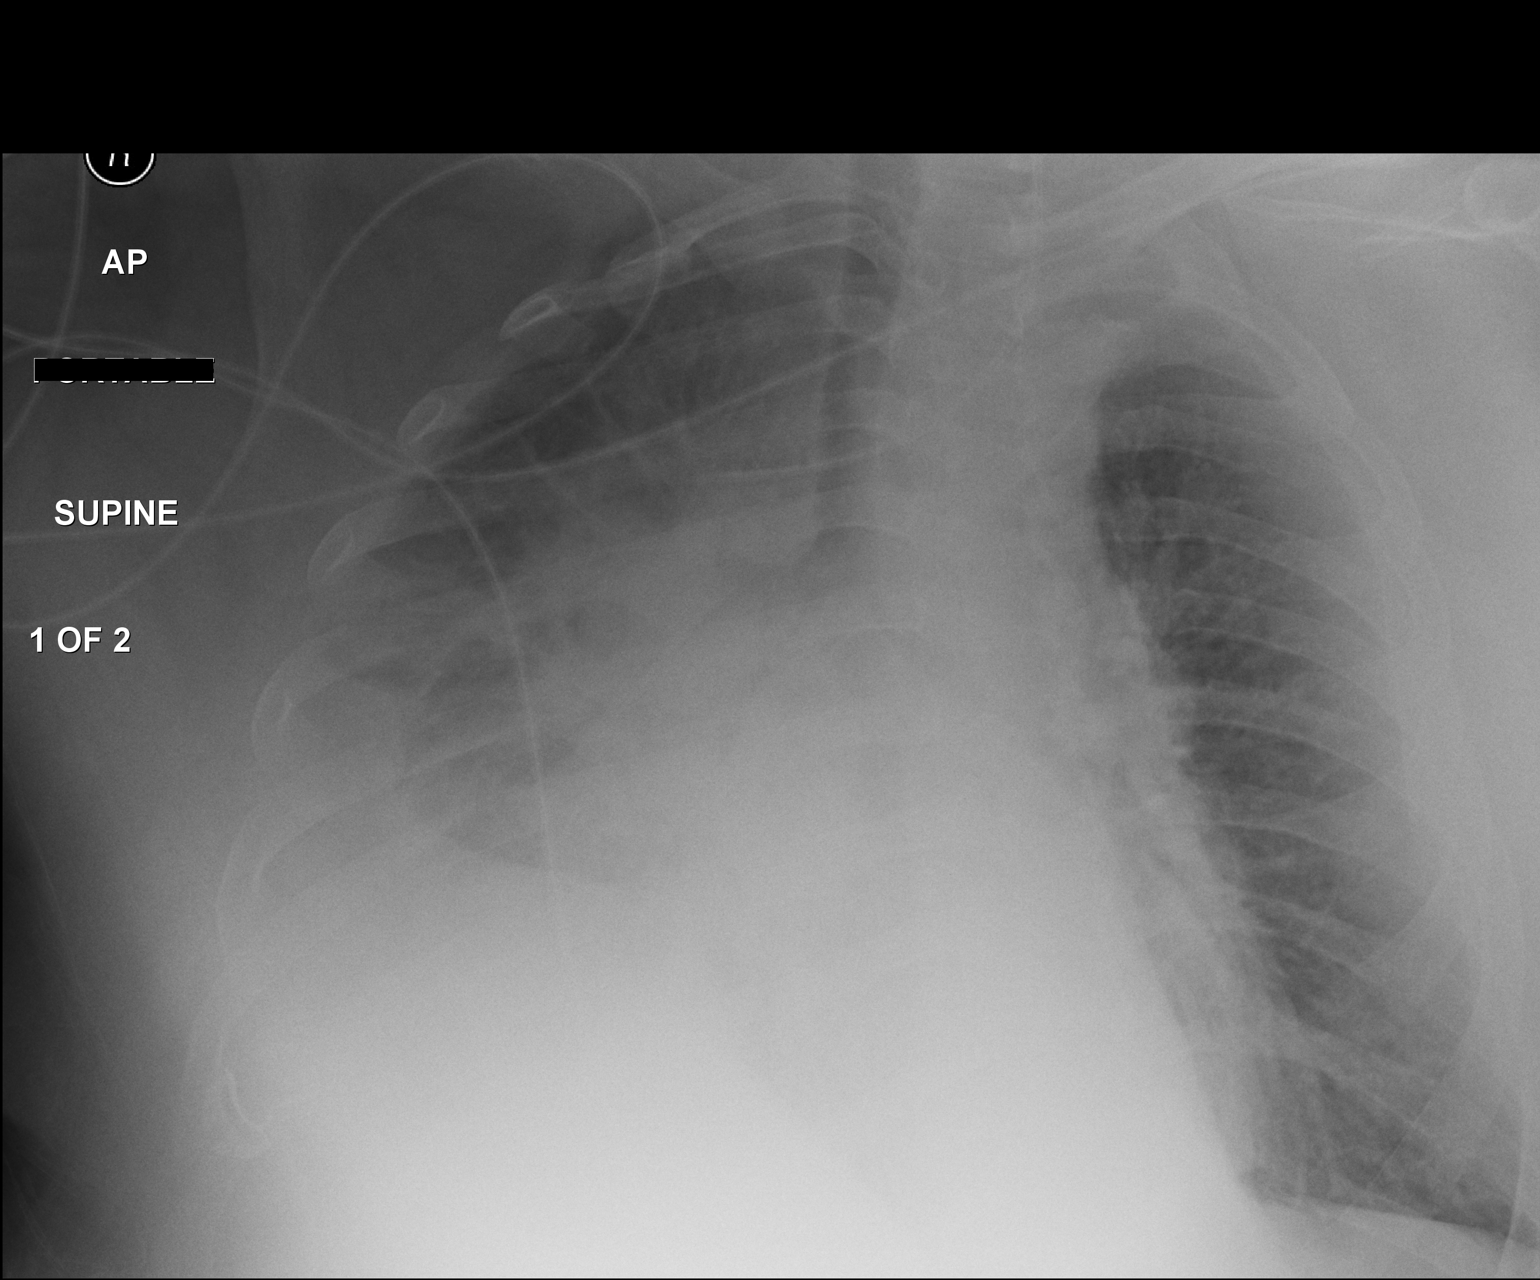

[AP (2 of 2)]
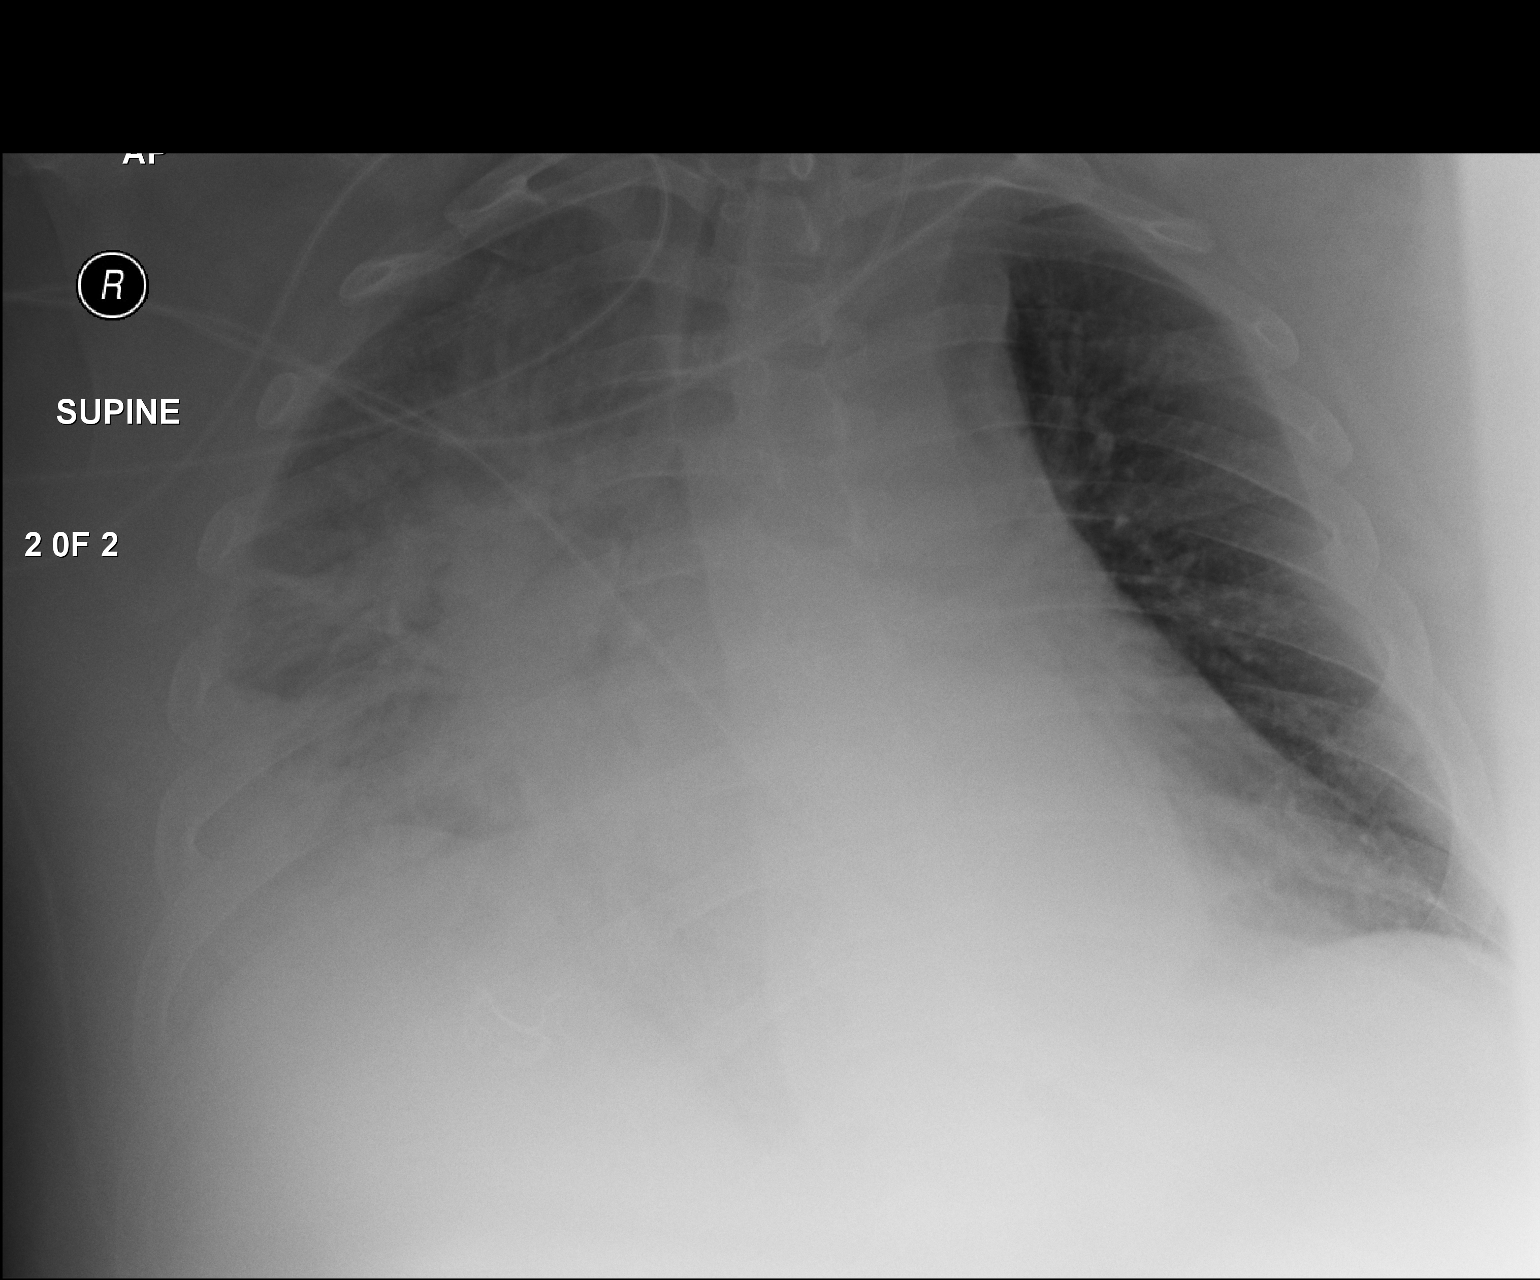

[2 of 2 positions shown; findings below may reference images not displayed]

FINDINGS: Exam detail is suboptimal due to patient body habitus. Left IJ
approach central line tip terminates at the brachiocephalic/SVC
junction. No pneumothorax. Right hilar and basilar
consolidation/opacification reidentified. Large right partly
loculated pleural effusion is noted. Apparent cardiomegaly is
identified.
IMPRESSION: Left IJ central line with tip at the SVC/brachiocephalic junction.
No pneumothorax. Stable findings as above.
